# Patient Record
Sex: Male | Born: 1960 | Race: White | Hispanic: No | Marital: Married | Smoking: Never smoker
Health system: Southern US, Community
[De-identification: ages and names within clinical notes are randomized; demographics above are authoritative.]

## PROBLEM LIST (undated history)

## (undated) ENCOUNTER — Emergency Department (HOSPITAL_BASED_OUTPATIENT_CLINIC_OR_DEPARTMENT_OTHER): Disposition: A | Discharge status: Home / Self Care | Payer: Managed Care, Other (non HMO)

## (undated) DIAGNOSIS — K644 Residual hemorrhoidal skin tags: Secondary | ICD-10-CM

## (undated) DIAGNOSIS — D649 Anemia, unspecified: Secondary | ICD-10-CM

## (undated) HISTORY — DX: Residual hemorrhoidal skin tags: K64.4

## (undated) HISTORY — PX: CYSTECTOMY: SUR359

## (undated) HISTORY — DX: Anemia, unspecified: D64.9

---

## 1994-05-26 HISTORY — PX: BRAIN SURGERY: SHX531

## 2013-11-08 ENCOUNTER — Emergency Department (HOSPITAL_BASED_OUTPATIENT_CLINIC_OR_DEPARTMENT_OTHER)
Admission: EM | Admit: 2013-11-08 | Discharge: 2013-11-08 | Disposition: A | Discharge status: Home / Self Care | Payer: Managed Care, Other (non HMO) | Attending: Emergency Medicine | Admitting: Emergency Medicine

## 2013-11-08 ENCOUNTER — Encounter (HOSPITAL_BASED_OUTPATIENT_CLINIC_OR_DEPARTMENT_OTHER): Payer: Self-pay | Admitting: Emergency Medicine

## 2013-11-08 DIAGNOSIS — W268XXA Contact with other sharp object(s), not elsewhere classified, initial encounter: Secondary | ICD-10-CM | POA: Insufficient documentation

## 2013-11-08 DIAGNOSIS — Y939 Activity, unspecified: Secondary | ICD-10-CM | POA: Insufficient documentation

## 2013-11-08 DIAGNOSIS — S0100XA Unspecified open wound of scalp, initial encounter: Secondary | ICD-10-CM | POA: Insufficient documentation

## 2013-11-08 DIAGNOSIS — S0101XA Laceration without foreign body of scalp, initial encounter: Secondary | ICD-10-CM

## 2013-11-08 DIAGNOSIS — Y929 Unspecified place or not applicable: Secondary | ICD-10-CM | POA: Insufficient documentation

## 2013-11-08 DIAGNOSIS — Y990 Civilian activity done for income or pay: Secondary | ICD-10-CM | POA: Insufficient documentation

## 2013-11-08 NOTE — ED Notes (Signed)
MD at bedside. 

## 2013-11-08 NOTE — ED Provider Notes (Signed)
CSN: [id number]     Arrival date & time 11/08/13  1648 History   First MD Initiated Contact with Patient 11/08/13 1658     Chief Complaint  Patient presents with  . Head Injury     (Consider location/radiation/quality/duration/timing/severity/associated sxs/prior Treatment) HPI  This a 53 year old male who presents with laceration to the scalp. Patient sustained the injury 1 PM today. He states that a piece of metal on a machine cut his head. He has noted oozing from the site since then. He denies any other injury. He denies loss of consciousness. Tetanus shot is up-to-date. He does have a history of cystectomy with a bone flap on that side of his head.  History reviewed. No pertinent past medical history. Past Surgical History  Procedure Laterality Date  . Cystectomy     No family history on file. History  Substance Use Topics  . Smoking status: Never Smoker   . Smokeless tobacco: Not on file  . Alcohol Use: Yes    Review of Systems  Skin:       Scalp laceration  Neurological: Negative for dizziness and headaches.  All other systems reviewed and are negative.     Allergies  Review of patient's allergies indicates no known allergies.  Home Medications   Prior to Admission medications   Not on File   BP 112/78  Pulse 71  Temp(Src) 98.3 F (36.8 C) (Oral)  Resp 16  Ht 5\' 9"  (1.753 m)  Wt 165 lb 5.5 oz (75 kg)  BMI 24.41 kg/m2  SpO2 97% Physical Exam  Nursing note and vitals reviewed. Constitutional: He is oriented to person, place, and time. He appears well-developed and well-nourished. No distress.  HENT:  2 cm laceration to the scalp just lateral to the vertex on the right, there is notable indentation from prior cystectomy  Eyes: Pupils are equal, round, and reactive to light.  Cardiovascular: Normal rate and regular rhythm.   No murmur heard. Pulmonary/Chest: Effort normal. No respiratory distress.  Musculoskeletal: He exhibits no edema.   Lymphadenopathy:    He has no cervical adenopathy.  Neurological: He is alert and oriented to person, place, and time.  Skin: Skin is warm and dry.  Wound as noted above  Psychiatric: He has a normal mood and affect.    ED Course  Procedures (including critical care time)  LACERATION REPAIR Performed by: Thayer Jew, F Authorized by: Thayer Jew, F Consent: Verbal consent obtained. Risks and benefits: risks, benefits and alternatives were discussed Consent given by: patient Patient identity confirmed: provided demographic data Prepped and Draped in normal sterile fashion Wound explored  Laceration Location: scalp  Laceration Length: 2cm  No Foreign Bodies seen or palpated   Irrigation method: syringe Amount of cleaning: standard  Skin closure: hair approximation and dermabond  Technique:  Here from either side of the laceration was twisted to approximate the laceration and Dermabond was applied to seal  approximation  Patient tolerance: Patient tolerated the procedure well with no immediate complications.  Labs Review Labs Reviewed - No data to display  Imaging Review No results found.   EKG Interpretation None      MDM   Final diagnoses:  Scalp laceration    Patient presents with simple scalp laceration. Tetanus is up-to-date. No other known injury. Patient with like to avoid staples. His hair is long enough for hair approximation with Dermabond application. Patient tolerated the procedure well. Discussed with patient laceration care. Patient stated understanding.  After history, exam,  and medical workup I feel the patient has been appropriately medically screened and is safe for discharge home. Pertinent diagnoses were discussed with the patient. Patient was given return precautions.     Merryl Hacker, MD 11/08/13 [phone]

## 2013-11-08 NOTE — ED Notes (Signed)
Pt hit his head on a machine while at work. Now haas a very small abrasion/lac to top right of head oozing blood. Pt denies LOC, vision changes or nausea.

## 2013-11-08 NOTE — Discharge Instructions (Signed)
Tissue Adhesive Wound Care Do not wash your hair for 24 hrs and try to avoid soaking the glued area.  Some cuts, wounds, lacerations, and incisions can be repaired by using tissue adhesive. Tissue adhesive is like glue. It holds the skin together, allowing for faster healing. It forms a strong bond on the skin in about 1 minute and reaches its full strength in about 2 or 3 minutes. The adhesive disappears naturally while the wound is healing. It is important to take proper care of your wound at home while it heals.  HOME CARE INSTRUCTIONS   Showers are allowed. Do not soak the area containing the tissue adhesive. Do not take baths, swim, or use hot tubs. Do not use any soaps or ointments on the wound. Certain ointments can weaken the glue.  If a bandage (dressing) has been applied, follow your health care provider's instructions for how often to change the dressing.   Keep the dressing dry if one has been applied.   Do not scratch, pick, or rub the adhesive.   Do not place tape over the adhesive. The adhesive could come off when pulling the tape off.   Protect the wound from further injury until it is healed.   Protect the wound from sun and tanning bed exposure while it is healing and for several weeks after healing.   Only take over-the-counter or prescription medicines as directed by your health care provider.   Keep all follow-up appointments as directed by your health care provider. SEEK IMMEDIATE MEDICAL CARE IF:   Your wound becomes red, swollen, hot, or tender.   You develop a rash after the glue is applied.  You have increasing pain in the wound.   You have a red streak that goes away from the wound.   You have pus coming from the wound.   You have increased bleeding.  You have a fever.  You have shaking chills.   You notice a bad smell coming from the wound.   Your wound or adhesive breaks open.  MAKE SURE YOU:   Understand these  instructions.  Will watch your condition.  Will get help right away if you are not doing well or get worse. Document Released: 11/05/2000 Document Revised: 03/02/2013 Document Reviewed: 12/01/2012 [facility] Patient Information 2014 [place], [place].

## 2016-05-26 HISTORY — PX: HEMORRHOID SURGERY: SHX153

## 2016-10-22 ENCOUNTER — Emergency Department (HOSPITAL_BASED_OUTPATIENT_CLINIC_OR_DEPARTMENT_OTHER)
Admission: EM | Admit: 2016-10-22 | Discharge: 2016-10-23 | Disposition: A | Discharge status: Home / Self Care | Payer: Managed Care, Other (non HMO) | Attending: Emergency Medicine | Admitting: Emergency Medicine

## 2016-10-22 ENCOUNTER — Encounter (HOSPITAL_BASED_OUTPATIENT_CLINIC_OR_DEPARTMENT_OTHER): Payer: Self-pay

## 2016-10-22 ENCOUNTER — Emergency Department (HOSPITAL_BASED_OUTPATIENT_CLINIC_OR_DEPARTMENT_OTHER): Payer: Managed Care, Other (non HMO)

## 2016-10-22 DIAGNOSIS — Y999 Unspecified external cause status: Secondary | ICD-10-CM | POA: Diagnosis not present

## 2016-10-22 DIAGNOSIS — Y939 Activity, unspecified: Secondary | ICD-10-CM | POA: Insufficient documentation

## 2016-10-22 DIAGNOSIS — S40021A Contusion of right upper arm, initial encounter: Secondary | ICD-10-CM | POA: Diagnosis not present

## 2016-10-22 DIAGNOSIS — Y929 Unspecified place or not applicable: Secondary | ICD-10-CM | POA: Diagnosis not present

## 2016-10-22 DIAGNOSIS — M84822 Other disorders of continuity of bone, left humerus: Secondary | ICD-10-CM | POA: Insufficient documentation

## 2016-10-22 DIAGNOSIS — S40921A Unspecified superficial injury of right upper arm, initial encounter: Secondary | ICD-10-CM | POA: Diagnosis present

## 2016-10-22 DIAGNOSIS — S8002XA Contusion of left knee, initial encounter: Secondary | ICD-10-CM | POA: Diagnosis not present

## 2016-10-22 DIAGNOSIS — D1622 Benign neoplasm of long bones of left lower limb: Secondary | ICD-10-CM
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
## 2016-10-22 NOTE — ED Notes (Signed)
Pt to desk multiple times demanding the doctor to go see his wife who is also a pt. Stating pt needs pain medicine now. Explained to pt that doctor had to order pain medicine and would be in to see her as soon as possible. Pt refuses to stay in his room and is in his wife's room currently.

## 2016-10-22 NOTE — ED Notes (Signed)
ED Provider at bedside. 
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
## 2016-10-23 NOTE — Discharge Instructions (Signed)
Take acetaminophen, ibuprofen, or naproxen as needed for pain.  Your x-rays showed a spot on your left femur. This is probably benign, but you should follow up with the orthopedic doctor.

## 2016-11-03 ENCOUNTER — Encounter (INDEPENDENT_AMBULATORY_CARE_PROVIDER_SITE_OTHER): Payer: Self-pay | Admitting: Orthopaedic Surgery

## 2016-11-03 ENCOUNTER — Ambulatory Visit (INDEPENDENT_AMBULATORY_CARE_PROVIDER_SITE_OTHER): Payer: Managed Care, Other (non HMO)

## 2016-11-03 ENCOUNTER — Ambulatory Visit (INDEPENDENT_AMBULATORY_CARE_PROVIDER_SITE_OTHER): Payer: Managed Care, Other (non HMO) | Admitting: Orthopaedic Surgery

## 2016-11-03 DIAGNOSIS — D169 Benign neoplasm of bone and articular cartilage, unspecified: Secondary | ICD-10-CM | POA: Diagnosis not present
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
## 2018-11-24 ENCOUNTER — Telehealth: Payer: Self-pay | Admitting: Family Medicine

## 2018-11-24 NOTE — Telephone Encounter (Signed)
Attempted to call pt to reschd appt. # listed was not a working number

## 2018-12-08 ENCOUNTER — Ambulatory Visit: Payer: Managed Care, Other (non HMO) | Admitting: Family Medicine

## 2019-01-10 ENCOUNTER — Ambulatory Visit: Payer: Managed Care, Other (non HMO) | Admitting: Family Medicine

## 2019-01-10 ENCOUNTER — Encounter: Payer: Self-pay | Admitting: Family Medicine

## 2019-01-10 ENCOUNTER — Other Ambulatory Visit: Payer: Self-pay

## 2019-01-10 VITALS — BP 122/82 | HR 62 | Temp 98.2°F | Ht 72.0 in | Wt 171.1 lb

## 2019-01-10 DIAGNOSIS — B353 Tinea pedis: Secondary | ICD-10-CM | POA: Diagnosis not present

## 2019-01-10 DIAGNOSIS — K635 Polyp of colon: Secondary | ICD-10-CM | POA: Diagnosis not present

## 2019-01-10 DIAGNOSIS — B351 Tinea unguium: Secondary | ICD-10-CM

## 2019-01-10 DIAGNOSIS — L989 Disorder of the skin and subcutaneous tissue, unspecified: Secondary | ICD-10-CM | POA: Diagnosis not present

## 2019-01-10 DIAGNOSIS — R202 Paresthesia of skin: Secondary | ICD-10-CM

## 2019-01-10 DIAGNOSIS — K644 Residual hemorrhoidal skin tags: Secondary | ICD-10-CM

## 2019-01-10 DIAGNOSIS — N529 Male erectile dysfunction, unspecified: Secondary | ICD-10-CM

## 2019-01-10 MED ORDER — TADALAFIL 20 MG PO TABS: 20.0000 mg | ORAL_TABLET | Freq: Every day | ORAL | 4 refills | Status: DC | PRN

## 2019-01-10 MED ORDER — KETOCONAZOLE 2 % EX CREA: 1.0000 "application " | TOPICAL_CREAM | Freq: Every day | CUTANEOUS | 0 refills | Status: DC

## 2019-01-10 NOTE — Progress Notes (Signed)
Chief Complaint  Patient presents with  . New Patient (Initial Visit)    derm referral//podiatrist  . Referral    colonoscopy       New Patient Visit SUBJECTIVE: HPI: Michael Maldonado is an 58 y.o.male who is being seen for establishing care.  The patient was previously seen at [facility].  Hx of hemorrhoids.  He continues to have them intermittently.  They itch more than anything.  He saw GI 4-5 years ago and was found to have polyps and internal hemorrhoids.  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  He attributes the hemorrhoids to this.  His stools are relatively normal with occasional bouts of constipation.  Do not seem to correlate with his episodes of hemorrhoids.  No urinary complaints.  The patient has a personal history of colon polyps and a family history of colon cancer in his mother.  Last colonoscopy was 4-5 years ago.  He believes it was from Dr. Alice Reichert.  Follow-up was recommended for 2020 and he is requesting a referral.  No blood in the stools.   Patient has a history of erectile dysfunction.  He has used Cialis in the past and it worked well.  He would like a refill.  Patient has a history of left arm tingling.  This started around 3 months ago.  There is no injury or change in activity.  It comes intermittently and he could be doing anything.  He does not associate with exertion.  No chest pain or shortness of breath.  He denies any weakness or pain.  It seems to be getting better overall.  Denies fevers, bruising, unintentional weight loss.  The patient has had fungus on his toes for several months.  It affects both of his great toes.  They are not ingrown.  His right pinky is also affected.  He has tried an over-the-counter topical but is not consistent with it.  Denies any pain.  Patient has a history of a skin lesion on his right third medial toe.  It is white and sometimes itchy.  He has not tried anything on it so far.  He questions whether it is athlete's foot.   No new topicals.  He has skin lesions on face that he would like examined by a specialist. He treated with vinegar, they got better and then came back.   No Known Allergies  Past Medical History:  Diagnosis Date  . External hemorrhoids    Past Surgical History:  Procedure Laterality Date  . BRAIN SURGERY  1996   Remove araenoid cyst benign  . CYSTECTOMY    . HEMORRHOID SURGERY  2018   Family History  Problem Relation Age of Onset  . Cancer Mother        colon  . Hearing loss Father    No Known Allergies  Current Outpatient Medications:  Marland Kitchen  Multiple Vitamin (MULTIVITAMIN) tablet, Take 1 tablet by mouth daily., Disp: , Rfl:  .  Potassium 99 MG TABS, Take 1 tablet by mouth daily., Disp: , Rfl:  .  Zinc 50 MG CAPS, Take 1 each by mouth., Disp: , Rfl:  .  ketoconazole (NIZORAL) 2 % cream, Apply 1 application topically daily., Disp: 30 g, Rfl: 0 .  tadalafil (CIALIS) 20 MG tablet, Take 1 tablet (20 mg total) by mouth daily as needed for erectile dysfunction., Disp: 10 tablet, Rfl: 4  ROS Const: no fevers Cardiac: No CP Lungs: no sob Neuro: +tingling, no weakness MSK: No muscle  pain Skin: as above Endo: No wt loss GI: No bleeding GU: +ED Heme: No bruising   OBJECTIVE: BP 122/82 (BP Location: Left Arm, Patient Position: Sitting, Cuff Size: Normal)   Pulse 62   Temp 98.2 F (36.8 C) (Oral)   Ht 6' (1.829 m)   Wt 171 lb 2 oz (77.6 kg)   SpO2 97%   BMI 23.21 kg/m   Constitutional: -  VS reviewed -  Well developed, well nourished, appears stated age -  No apparent distress  Psychiatric: -  Oriented to person, place, and time -  Memory intact -  Affect and mood normal -  Fluent conversation, good eye contact -  Judgment and insight age appropriate  Eye: -  Conjunctivae clear, no discharge -  Pupils symmetric, round, reactive to light  ENMT: -  MMM    Pharynx moist, no exudate, no erythema  Neck: -  No gross swelling, no palpable masses -  Thyroid midline, not  enlarged, mobile, no palpable masses  Cardiovascular: -  RRR -  No bruits -  No LE edema  Respiratory: -  Normal respiratory effort, no accessory muscle use, no retraction -  Breath sounds equal, no wheezes, no ronchi, no crackles  Gastrointestinal: -  Bowel sounds normal -  No tenderness, no distention, no guarding, no masses  Neurological:  -  CN II - XII grossly intact -  5/5 strength throughout -  Neg Spurling's b/l -  DTR's equal and symmetric, no clonus -  No cerebellar signs -  Sensation intact to pinprick in UE's, equal bilaterally  Musculoskeletal: -  No clubbing, no cyanosis -  Gait normal  Skin: -  Thickened 1st and 5th nail on R, 1st nail on L, all yellowed -  Over the medial 3rd digit on R foot, there is a macerated circular lesion measuring approx 1 cm in diameter -  On the face there are various areas of excoriation and a few scaly patches, some areas of telangiectasias noted as well -  Warm and dry to palpation   ASSESSMENT/PLAN: Skin lesions - Plan: Ambulatory referral to Dermatology, many of lesions today are from shaving. There are a few he would like examined by a dermatologist. Referral placed.   External hemorrhoids - Plan: lift carefully, otc hydrocort ptn  Polyp of colon, unspecified part of colon, unspecified type - Plan: Ambulatory referral to Gastroenterology  Tinea pedis of right foot - Plan: ketoconazole qd for 6 weeks.  Onychomycosis - Plan: topical Vick's, discussed terbinafine, but he declined PO tx  Erectile dysfunction, unspecified erectile dysfunction type - Plan: Cialis  Paresthesias - Plan: reassurance for now. Can discuss lab testing at CPE. Stretch pecs for now.   Patient should return in 1 mo for CPE. The patient voiced understanding and agreement to the plan.   [place], DO 01/10/19  2:00 PM

## 2019-01-10 NOTE — Patient Instructions (Addendum)
If you do not hear anything about your referrals in the next 1-2 weeks, call our office and ask for an update.  Keep the diet clean and stay active.  Apply Vick's VapoRub on your toenails daily. This can take 9-15 months to show results.   The tingling does not appear to be anything particularly dangerous. If anything changes, let me know.   Let us know if you need anything.   Pectoralis Major Rehab Ask your health care provider which exercises are safe for you. Do exercises exactly as told by your health care provider and adjust them as directed. It is normal to feel mild stretching, pulling, tightness, or discomfort as you do these exercises, but you should stop right away if you feel sudden pain or your pain gets worse.Do not begin these exercises until told by your health care provider. Stretching and range of motion exercises These exercises warm up your muscles and joints and improve the movement and flexibility of your shoulder. These exercises can also help to relieve pain, numbness, and tingling. Exercise A: Pendulum  1. Stand near a wall or a surface that you can hold onto for balance. 2. Bend at the waist and let your left / right arm hang straight down. Use your other arm to keep your balance. 3. Relax your arm and shoulder muscles, and move your hips and your trunk so your left / right arm swings freely. Your arm should swing because of the motion of your body, not because you are using your arm or shoulder muscles. 4. Keep moving so your arm swings in the following directions, as told by your health care provider: ? Side to side. ? Forward and backward. ? In clockwise and counterclockwise circles. 5. Slowly return to the starting position. Repeat 2 times. Complete this exercise 3 times per week. Exercise B: Abduction, standing 1. Stand and hold a broomstick, a cane, or a similar object. Place your hands a little more than shoulder-width apart on the object. Your left / right  hand should be palm-up, and your other hand should be palm-down. 2. While keeping your elbow straight and your shoulder muscles relaxed, push the stick across your body toward your left / right side. Raise your left / right arm to the side of your body and then over your head until you feel a stretch in your shoulder. ? Stop when you reach the angle that is recommended by your health care provider. ? Avoid shrugging your shoulder while you raise your arm. Keep your shoulder blade tucked down toward the middle of your spine. 3. Hold for 10 seconds. 4. Slowly return to the starting position. Repeat 2 times. Complete this exercise 3 times per week. Exercise C: Wand flexion, supine  1. Lie on your back. You may bend your knees for comfort. 2. Hold a broomstick, a cane, or a similar object so that your hands are about shoulder-width apart on the object. Your palms should face toward your feet. 3. Raise your left / right arm in front of your face, then behind your head (toward the floor). Use your other hand to help you do this. Stop when you feel a gentle stretch in your shoulder, or when you reach the angle that is recommended by your health care provider. 4. Hold for 3 seconds. 5. Use the broomstick and your other arm to help you return your left / right arm to the starting position. Repeat 2 times. Complete this exercise 3 times per week.  Exercise D: Wand shoulder external rotation 1. Stand and hold a broomstick, a cane, or a similar object so your handsare about shoulder-width apart on the object. 2. Start with your arms hanging down, then bend both elbows to an "L" shape (90 degrees). 3. Keep your left / right elbow at your side. Use your other hand to push the stick so your left / right forearm moves away from your body, out to your side. ? Keep your left / right elbow bent to 90 degrees and keep it against your side. ? Stop when you feel a gentle stretch in your shoulder, or when you reach the  angle recommended by your health care provider. 4. Hold for 10 seconds. 5. Use the stick to help you return your left / right arm to the starting position. Repeat 2 times. Complete this exercise 3 times per week. Strengthening exercises These exercises build strength and endurance in your shoulder. Endurance is the ability to use your muscles for a long time, even after your muscles get tired. Exercise E: Scapular protraction, standing 1. Stand so you are facing a wall. Place your feet about one arm-length away from the wall. 2. Place your hands on the wall and straighten your elbows. 3. Keep your hands on the wall as you push your upper back away from the wall. You should feel your shoulder blades sliding forward.Keep your elbows and your head still. ? If you are not sure that you are doing this exercise correctly, ask your health care provider for more instructions. 4. Hold for 3 seconds. 5. Slowly return to the starting position. Let your muscles relax completely before you repeat this exercise. Repeat 2 times. Complete this exercise 3 times per week. Exercise F: Shoulder blade squeezes  (scapular retraction) 1. Sit with good posture in a stable chair. Do not let your back touch the back of the chair. 2. Your arms should be at your sides with your elbows bent. You may rest your forearms on a pillow if that is more comfortable. 3. Squeeze your shoulder blades together. Bring them down and back. ? Keep your shoulders level. ? Do not lift your shoulders up toward your ears. 4. Hold for 3 seconds. 5. Return to the starting position. Repeat 2 times. Complete this exercise 3 times per week. This information is not intended to replace advice given to you by your health care provider. Make sure you discuss any questions you have with your health care provider. Document Released: 05/12/2005 Document Revised: 02/21/2016 Document Reviewed: 01/28/2015 Elsevier Interactive Patient Education  [employer].

## 2019-02-11 ENCOUNTER — Telehealth: Payer: Self-pay | Admitting: Gastroenterology

## 2019-02-11 NOTE — Telephone Encounter (Signed)
Hi Dr. Fuller Plan, we have received a referral from pt's PCP for a repeat colon, Pt had last colon in 2015. Records will be placed on your desk for review. Please advise for scheduling. Thank you.

## 2019-02-15 ENCOUNTER — Encounter: Payer: Managed Care, Other (non HMO) | Admitting: Family Medicine

## 2019-02-21 ENCOUNTER — Other Ambulatory Visit: Payer: Self-pay

## 2019-02-21 ENCOUNTER — Ambulatory Visit (INDEPENDENT_AMBULATORY_CARE_PROVIDER_SITE_OTHER): Payer: Managed Care, Other (non HMO) | Admitting: Family Medicine

## 2019-02-21 ENCOUNTER — Encounter: Payer: Self-pay | Admitting: Family Medicine

## 2019-02-21 VITALS — BP 110/70 | HR 70 | Temp 96.7°F | Ht 69.0 in | Wt 168.2 lb

## 2019-02-21 DIAGNOSIS — R0681 Apnea, not elsewhere classified: Secondary | ICD-10-CM | POA: Diagnosis not present

## 2019-02-21 DIAGNOSIS — Z2821 Immunization not carried out because of patient refusal: Secondary | ICD-10-CM | POA: Diagnosis not present

## 2019-02-21 DIAGNOSIS — Z1159 Encounter for screening for other viral diseases: Secondary | ICD-10-CM

## 2019-02-21 DIAGNOSIS — Z0000 Encounter for general adult medical examination without abnormal findings: Secondary | ICD-10-CM | POA: Diagnosis not present

## 2019-02-21 LAB — CBC
HCT: 46.1 % (ref 39.0–52.0)
Hemoglobin: 15.6 g/dL (ref 13.0–17.0)
MCHC: 33.8 g/dL (ref 30.0–36.0)
MCV: 88.6 fl (ref 78.0–100.0)
Platelets: 173 10*3/uL (ref 150.0–400.0)
RBC: 5.2 Mil/uL (ref 4.22–5.81)
RDW: 12.7 % (ref 11.5–15.5)
WBC: 6.4 10*3/uL (ref 4.0–10.5)

## 2019-02-21 LAB — COMPREHENSIVE METABOLIC PANEL
ALT: 14 U/L (ref 0–53)
AST: 14 U/L (ref 0–37)
Albumin: 4.7 g/dL (ref 3.5–5.2)
Alkaline Phosphatase: 57 U/L (ref 39–117)
BUN: 24 mg/dL — ABNORMAL HIGH (ref 6–23)
CO2: 29 mEq/L (ref 19–32)
Calcium: 9.6 mg/dL (ref 8.4–10.5)
Chloride: 103 mEq/L (ref 96–112)
Creatinine, Ser: 1.06 mg/dL (ref 0.40–1.50)
GFR: 72.15 mL/min (ref >60.00)
Glucose, Bld: 84 mg/dL (ref 70–99)
Potassium: 3.9 mEq/L (ref 3.5–5.1)
Sodium: 140 mEq/L (ref 135–145)
Total Bilirubin: 0.8 mg/dL (ref 0.2–1.2)
Total Protein: 6.9 g/dL (ref 6.0–8.3)

## 2019-02-21 LAB — LIPID PANEL
Cholesterol: 170 mg/dL (ref 0–200)
HDL: 52 mg/dL (ref >39.00)
LDL Cholesterol: 103 mg/dL — ABNORMAL HIGH (ref 0–99)
NonHDL: 117.82
Total CHOL/HDL Ratio: 3
Triglycerides: 75 mg/dL (ref 0.0–149.0)
VLDL: 15 mg/dL (ref 0.0–40.0)

## 2019-02-21 NOTE — Progress Notes (Signed)
Chief Complaint  Patient presents with  . Annual Exam    snoring    Well Male Michael Maldonado is here for a complete physical.   His last physical was >1 year ago.  Current diet: in general, a "healthy" diet.  Current exercise: active at work Weight trend: stable Daytime fatigue? Some. +witness apneic episodes and snoring. Seat belt? Yes.    Health maintenance Shingrix- No Colonoscopy- in process Tetanus- Yes- thinks he did get it HIV- Yes Hep C- No   Past Medical History:  Diagnosis Date  . External hemorrhoids       Past Surgical History:  Procedure Laterality Date  . BRAIN SURGERY  1996   Remove araenoid cyst benign  . CYSTECTOMY    . HEMORRHOID SURGERY  2018    Medications  Current Outpatient Medications on File Prior to Visit  Medication Sig Dispense Refill  . ketoconazole (NIZORAL) 2 % cream Apply 1 application topically daily. 30 g 0  . Multiple Vitamin (MULTIVITAMIN) tablet Take 1 tablet by mouth daily.    . Potassium 99 MG TABS Take 1 tablet by mouth daily.    . tadalafil (CIALIS) 20 MG tablet Take 1 tablet (20 mg total) by mouth daily as needed for erectile dysfunction. 10 tablet 4  . Zinc 50 MG CAPS Take 1 each by mouth.     Allergies No Known Allergies  Family History Family History  Problem Relation Age of Onset  . Cancer Mother        colon  . Hearing loss Father     Review of Systems: Constitutional:  no fevers Eye:  no recent significant change in vision Ear/Nose/Mouth/Throat:  Ears:  no hearing loss Nose/Mouth/Throat:  no complaints of nasal congestion, no sore throat Cardiovascular:  no chest pain, no palpitations Respiratory:  no cough and no shortness of breath Gastrointestinal:  no abdominal pain, no change in bowel habits GU:  Male: negative for dysuria, frequency, and incontinence and negative for prostate symptoms Musculoskeletal/Extremities:  no pain, redness, or swelling of the joints Integumentary (Skin/Breast):  no abnormal  skin lesions reported Neurologic:  no headaches Endocrine: No unexpected weight changes Hematologic/Lymphatic:  no abnormal bleeding  Exam BP 110/70 (BP Location: Left Arm, Patient Position: Sitting, Cuff Size: Normal)   Pulse 70   Temp (!) 96.7 F (35.9 C) (Temporal)   Ht 5\' 9"  (1.753 m)   Wt 168 lb 4 oz (76.3 kg)   SpO2 97%   BMI 24.85 kg/m  General:  well developed, well nourished, in no apparent distress Skin:  no significant moles, warts, or growths Head:  no masses, lesions, or tenderness Eyes:  pupils equal and round, sclera anicteric without injection Ears:  canals without lesions, TMs shiny without retraction, no obvious effusion, no erythema Nose:  nares patent, septum midline, mucosa normal Throat/Pharynx:  lips and gingiva without lesion; tongue and uvula midline; non-inflamed pharynx; no exudates or postnasal drainage Neck: neck supple without adenopathy, thyromegaly, or masses Lungs:  clear to auscultation, breath sounds equal bilaterally, no respiratory distress Cardio:  regular rate and rhythm, no LE edema, no bruits Abdomen:  abdomen soft, nontender; bowel sounds normal; no masses or organomegaly Rectal: Deferred Musculoskeletal:  symmetrical muscle groups noted without atrophy or deformity Extremities:  no clubbing, cyanosis, or edema, no deformities, no skin discoloration Neuro:  gait normal; deep tendon reflexes normal and symmetric Psych: well oriented with normal range of affect and appropriate judgment/insight  Assessment and Plan  Well adult exam -  Plan: CBC, Comprehensive metabolic panel, Lipid panel  Witnessed apneic spells - Plan: Ambulatory referral to Pulmonology  Encounter for hepatitis C screening test for low risk patient - Plan: Hepatitis C antibody  Refused influenza vaccine   Well 58 y.o. male. Counseled on diet and exercise. Counseled on risks and benefits of prostate cancer screening with PSA. The patient agrees to forego  testing. Immunizations, labs, and further orders as above. Going to look into when he had tdap.  Follow up in 1 yr or prn. The patient voiced understanding and agreement to the plan.  [place], DO 02/21/19 8:37 AM

## 2019-02-21 NOTE — Patient Instructions (Addendum)
Give [place] 2-3 business days to get the results of your labs back.   Keep the diet clean and stay active.  The new Shingrix vaccine (for shingles) is a 2 shot series. It can make people feel low energy, achy and almost like they have the flu for 48 hours after injection. Please plan accordingly when deciding on when to get this shot. Call our office for a nurse visit appointment to get this. The second shot of the series is less severe regarding the side effects, but it still lasts 48 hours.   If you do not hear anything about your referral in the next 1-2 weeks, call our office and ask for an update.   I recommend getting the flu shot in mid October. This suggestion would change if the CDC comes out with a different recommendation.    Let us know if you need anything.

## 2019-02-22 LAB — HEPATITIS C ANTIBODY
Hepatitis C Ab: NONREACTIVE
SIGNAL TO CUT-OFF: 0.01 (ref <1.00)

## 2019-02-23 ENCOUNTER — Encounter: Payer: Self-pay | Admitting: Family Medicine

## 2019-02-23 NOTE — Telephone Encounter (Signed)
Please update status of records review.  °

## 2019-02-23 NOTE — Telephone Encounter (Signed)
Records are not on his desk for review.

## 2019-02-24 NOTE — Telephone Encounter (Signed)
We were able to obtain records again, they will be sent for review.

## 2019-03-01 ENCOUNTER — Encounter: Payer: Self-pay | Admitting: Gastroenterology
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
## 2019-03-31 ENCOUNTER — Encounter: Payer: Self-pay | Admitting: Gastroenterology

## 2019-03-31 ENCOUNTER — Ambulatory Visit (AMBULATORY_SURGERY_CENTER): Payer: Self-pay | Admitting: *Deleted

## 2019-03-31 ENCOUNTER — Other Ambulatory Visit: Payer: Self-pay

## 2019-03-31 VITALS — Temp 96.6°F | Ht 70.0 in | Wt 167.4 lb

## 2019-03-31 DIAGNOSIS — Z800 Family history of malignant neoplasm of digestive organs: Secondary | ICD-10-CM

## 2019-03-31 MED ORDER — SUPREP BOWEL PREP KIT 17.5-3.13-1.6 GM/177ML PO SOLN: 1.0000 | Freq: Once | ORAL | 0 refills | Status: AC

## 2019-03-31 NOTE — Progress Notes (Signed)
No egg or soy allergy known to patient  No issues with past sedation with any surgeries  or procedures, no intubation problems  No diet pills per patient No home 02 use per patient  No blood thinners per patient  Pt denies issues with constipation  No A fib or A flutter  EMMI videogiven suprep coupon given covid test, [place] 04/20/2019 at 815  Due to the COVID-19 pandemic we are asking patients to follow these guidelines. Please only bring one care partner. Please be aware that your care partner may wait in the car in the parking lot or if they feel like they will be too hot to wait in the car, they may wait in the lobby on the 4th floor. All care partners are required to wear a mask the entire time (we do not have any that we can provide them), they need to practice social distancing, and we will do a Covid check for all patient's and care partners when you arrive. Also we will check their temperature and your temperature. If the care partner waits in their car they need to stay in the parking lot the entire time and we will call them on their cell phone when the patient is ready for discharge so they can bring the car to the front of the building. Also all patient's will need to wear a mask into building.

## 2019-04-25 ENCOUNTER — Encounter: Payer: Managed Care, Other (non HMO) | Admitting: Gastroenterology

## 2019-05-09 ENCOUNTER — Other Ambulatory Visit: Payer: Self-pay | Admitting: Gastroenterology

## 2019-05-09 ENCOUNTER — Ambulatory Visit (INDEPENDENT_AMBULATORY_CARE_PROVIDER_SITE_OTHER): Payer: Managed Care, Other (non HMO)

## 2019-05-09 DIAGNOSIS — Z1159 Encounter for screening for other viral diseases: Secondary | ICD-10-CM

## 2019-05-10 LAB — SARS CORONAVIRUS 2 (TAT 6-24 HRS): SARS Coronavirus 2: NEGATIVE

## 2019-05-11 ENCOUNTER — Encounter: Payer: Self-pay | Admitting: Gastroenterology

## 2019-05-11 ENCOUNTER — Ambulatory Visit (AMBULATORY_SURGERY_CENTER): Payer: Managed Care, Other (non HMO) | Admitting: Gastroenterology

## 2019-05-11 ENCOUNTER — Other Ambulatory Visit: Payer: Self-pay

## 2019-05-11 VITALS — BP 111/76 | HR 65 | Resp 10 | Ht 70.0 in | Wt 167.4 lb

## 2019-05-11 DIAGNOSIS — Z1211 Encounter for screening for malignant neoplasm of colon: Secondary | ICD-10-CM | POA: Diagnosis not present

## 2019-05-11 DIAGNOSIS — Z800 Family history of malignant neoplasm of digestive organs: Secondary | ICD-10-CM | POA: Diagnosis not present

## 2019-05-11 MED ORDER — SODIUM CHLORIDE 0.9 % IV SOLN: 500.0000 mL | Freq: Once | INTRAVENOUS | Status: DC

## 2019-05-11 NOTE — Progress Notes (Signed)
Pt's states no medical or surgical changes since previsit or office visit.  Temp taken by LC VS taken by CW  

## 2019-05-11 NOTE — Patient Instructions (Signed)
YOU HAD AN ENDOSCOPIC PROCEDURE TODAY AT THE [facility]:   Refer to the procedure report that was given to you for any specific questions about what was found during the examination.  If the procedure report does not answer your questions, please call your gastroenterologist to clarify.  If you requested that your care partner not be given the details of your procedure findings, then the procedure report has been included in a sealed envelope for you to review at your convenience later.  YOU SHOULD EXPECT: Some feelings of bloating in the abdomen. Passage of more gas than usual.  Walking can help get rid of the air that was put into your GI tract during the procedure and reduce the bloating. If you had a lower endoscopy (such as a colonoscopy or flexible sigmoidoscopy) you may notice spotting of blood in your stool or on the toilet paper. If you underwent a bowel prep for your procedure, you may not have a normal bowel movement for a few days.  Please Note:  You might notice some irritation and congestion in your nose or some drainage.  This is from the oxygen used during your procedure.  There is no need for concern and it should clear up in a day or so.  SYMPTOMS TO REPORT IMMEDIATELY:   Following lower endoscopy (colonoscopy or flexible sigmoidoscopy):  Excessive amounts of blood in the stool  Significant tenderness or worsening of abdominal pains  Swelling of the abdomen that is new, acute  Fever of 100F or higher  For urgent or emergent issues, a gastroenterologist can be reached at any hour by calling [phone].   DIET:  We do recommend a small meal at first, but then you may proceed to your regular diet.  Drink plenty of fluids but you should avoid alcoholic beverages for 24 hours.  ACTIVITY:  You should plan to take it easy for the rest of today and you should NOT DRIVE or use heavy machinery until tomorrow (because of the sedation medicines used during the test).     FOLLOW UP: Our staff will call the number listed on your records 48-72 hours following your procedure to check on you and address any questions or concerns that you may have regarding the information given to you following your procedure. If we do not reach you, we will leave a message.  We will attempt to reach you two times.  During this call, we will ask if you have developed any symptoms of COVID 19. If you develop any symptoms (ie: fever, flu-like symptoms, shortness of breath, cough etc.) before then, please call [phone].  If you test positive for Covid 19 in the 2 weeks post procedure, please call and report this information to us.    If any biopsies were taken you will be contacted by phone or by letter within the next 1-3 weeks.  Please call us at [phone] if you have not heard about the biopsies in 3 weeks.    SIGNATURES/CONFIDENTIALITY: You and/or your care partner have signed paperwork which will be entered into your electronic medical record.  These signatures attest to the fact that that the information above on your After Visit Summary has been reviewed and is understood.  Full responsibility of the confidentiality of this discharge information lies with you and/or your care-partner. 

## 2019-05-11 NOTE — Op Note (Signed)
[place] Patient Name: Michael Maldonado Procedure Date: 05/11/2019 12:28 PM MRN: [id number] Endoscopist: Ladene Artist , MD Age: 58 Referring MD:  Date of Birth: 1961/05/01 Gender: Male Account #: [id number] Procedure:                Colonoscopy Indications:              Screening in patient at increased risk: Family                            history of 1st-degree relative with colorectal                            cancer Medicines:                Monitored Anesthesia Care Procedure:                Pre-Anesthesia Assessment:                           - Prior to the procedure, a History and Physical                            was performed, and patient medications and                            allergies were reviewed. The patient's tolerance of                            previous anesthesia was also reviewed. The risks                            and benefits of the procedure and the sedation                            options and risks were discussed with the patient.                            All questions were answered, and informed consent                            was obtained. Prior Anticoagulants: The patient has                            taken no previous anticoagulant or antiplatelet                            agents. ASA Grade Assessment: II - A patient with                            mild systemic disease. After reviewing the risks                            and benefits, the patient was deemed in  satisfactory condition to undergo the procedure.                           After obtaining informed consent, the colonoscope                            was passed under direct vision. Throughout the                            procedure, the patient's blood pressure, pulse, and                            oxygen saturations were monitored continuously. The                            Colonoscope was introduced through the anus and                  advanced to the the cecum, identified by                            appendiceal orifice and ileocecal valve. The                            ileocecal valve, appendiceal orifice, and rectum                            were photographed. The quality of the bowel                            preparation was good. The colonoscopy was performed                            without difficulty. The patient tolerated the                            procedure well. Scope In: [id number] PM Scope Out: 12:48:52 PM Scope Withdrawal Time: 0 hours 10 minutes 25 seconds  Total Procedure Duration: 0 hours 11 minutes 53 seconds  Findings:                 The perianal and digital rectal examinations were                            normal.                           Multiple medium-mouthed diverticula were found in                            the entire colon. There was no evidence of                            diverticular bleeding.                           Internal hemorrhoids, scarring from likely  prior                            hemorrhoid banding were found during retroflexion.                            The hemorrhoids were small and Grade I (internal                            hemorrhoids that do not prolapse).                           The exam was otherwise without abnormality on                            direct and retroflexion views. Complications:            No immediate complications. Estimated blood loss:                            None. Estimated Blood Loss:     Estimated blood loss: none. Impression:               - Moderate diverticulosis in the entire examined                            colon.                           - Internal hemorrhoids.                           - The examination was otherwise normal on direct                            and retroflexion views.                           - No specimens collected. Recommendation:           - Repeat colonoscopy in 5 years for  screening                            purposes.                           - Patient has a contact number available for                            emergencies. The signs and symptoms of potential                            delayed complications were discussed with the                            patient. Return to normal activities tomorrow.  Written discharge instructions were provided to the                            patient.                           - High fiber diet.                           - Continue present medications.                           - Await pathology results. Ladene Artist, MD 05/11/2019 12:52:36 PM This report has been signed electronically.

## 2019-05-11 NOTE — Progress Notes (Signed)
Report given to PACU, vss 

## 2019-05-13 ENCOUNTER — Telehealth: Payer: Self-pay

## 2019-05-13 NOTE — Telephone Encounter (Signed)
Covid-19 screening questions   Do you now or have you had a fever in the last 14 days? No.  Do you have any respiratory symptoms of shortness of breath or cough now or in the last 14 days? No.  Do you have any family members or close contacts with diagnosed or suspected Covid-19 in the past 14 days? No.  Have you been tested for Covid-19 and found to be positive? No.       Follow up Call-  Call back number 05/11/2019  Post procedure Call Back phone  # [phone]  Permission to leave phone message Yes  Some recent data might be hidden     Patient questions:  Do you have a fever, pain , or abdominal swelling? No. Pain Score  0 *  Have you tolerated food without any problems? Yes.    Have you been able to return to your normal activities? Yes.    Do you have any questions about your discharge instructions: Diet   No. Medications  No. Follow up visit  No.  Do you have questions or concerns about your Care? No.  Actions: * If pain score is 4 or above: No action needed, pain <4.

## 2019-06-08 ENCOUNTER — Ambulatory Visit: Payer: Managed Care, Other (non HMO) | Attending: Internal Medicine

## 2019-06-08 DIAGNOSIS — Z20822 Contact with and (suspected) exposure to covid-19: Secondary | ICD-10-CM

## 2019-06-10 LAB — NOVEL CORONAVIRUS, NAA: SARS-CoV-2, NAA: NOT DETECTED

## 2020-01-02 ENCOUNTER — Other Ambulatory Visit (INDEPENDENT_AMBULATORY_CARE_PROVIDER_SITE_OTHER): Payer: Managed Care, Other (non HMO)

## 2020-01-02 ENCOUNTER — Encounter: Payer: Self-pay | Admitting: Family Medicine

## 2020-01-02 ENCOUNTER — Other Ambulatory Visit: Payer: Self-pay

## 2020-01-02 ENCOUNTER — Ambulatory Visit (INDEPENDENT_AMBULATORY_CARE_PROVIDER_SITE_OTHER): Payer: Managed Care, Other (non HMO) | Admitting: Family Medicine

## 2020-01-02 VITALS — BP 112/78 | HR 50 | Temp 98.0°F | Ht 69.0 in | Wt 176.0 lb

## 2020-01-02 DIAGNOSIS — B351 Tinea unguium: Secondary | ICD-10-CM | POA: Diagnosis not present

## 2020-01-02 DIAGNOSIS — Z0000 Encounter for general adult medical examination without abnormal findings: Secondary | ICD-10-CM

## 2020-01-02 DIAGNOSIS — R739 Hyperglycemia, unspecified: Secondary | ICD-10-CM

## 2020-01-02 DIAGNOSIS — Z23 Encounter for immunization: Secondary | ICD-10-CM

## 2020-01-02 DIAGNOSIS — L989 Disorder of the skin and subcutaneous tissue, unspecified: Secondary | ICD-10-CM

## 2020-01-02 LAB — LIPID PANEL
Cholesterol: 156 mg/dL (ref 0–200)
HDL: 48.1 mg/dL (ref >39.00)
LDL Cholesterol: 92 mg/dL (ref 0–99)
NonHDL: 107.88
Total CHOL/HDL Ratio: 3
Triglycerides: 79 mg/dL (ref 0.0–149.0)
VLDL: 15.8 mg/dL (ref 0.0–40.0)

## 2020-01-02 LAB — COMPREHENSIVE METABOLIC PANEL
ALT: 17 U/L (ref 0–53)
AST: 15 U/L (ref 0–37)
Albumin: 4.6 g/dL (ref 3.5–5.2)
Alkaline Phosphatase: 49 U/L (ref 39–117)
BUN: 22 mg/dL (ref 6–23)
CO2: 27 mEq/L (ref 19–32)
Calcium: 9.4 mg/dL (ref 8.4–10.5)
Chloride: 103 mEq/L (ref 96–112)
Creatinine, Ser: 1.11 mg/dL (ref 0.40–1.50)
GFR: 68.2 mL/min (ref >60.00)
Glucose, Bld: 105 mg/dL — ABNORMAL HIGH (ref 70–99)
Potassium: 3.8 mEq/L (ref 3.5–5.1)
Sodium: 138 mEq/L (ref 135–145)
Total Bilirubin: 0.4 mg/dL (ref 0.2–1.2)
Total Protein: 7.1 g/dL (ref 6.0–8.3)

## 2020-01-02 LAB — CBC
HCT: 45.1 % (ref 39.0–52.0)
Hemoglobin: 15.3 g/dL (ref 13.0–17.0)
MCHC: 34.1 g/dL (ref 30.0–36.0)
MCV: 87.9 fl (ref 78.0–100.0)
Platelets: 162 10*3/uL (ref 150.0–400.0)
RBC: 5.12 Mil/uL (ref 4.22–5.81)
RDW: 13.2 % (ref 11.5–15.5)
WBC: 6.3 10*3/uL (ref 4.0–10.5)

## 2020-01-02 LAB — HEMOGLOBIN A1C: Hgb A1c MFr Bld: 5.5 % (ref 4.6–6.5)

## 2020-01-02 MED ORDER — TERBINAFINE HCL 250 MG PO TABS: 250.0000 mg | ORAL_TABLET | Freq: Every day | ORAL | 3 refills | Status: DC

## 2020-01-02 MED ORDER — TADALAFIL 20 MG PO TABS: 20.0000 mg | ORAL_TABLET | Freq: Every day | ORAL | 4 refills | Status: DC | PRN

## 2020-01-02 NOTE — Addendum Note (Signed)
Addended by: Sharon Seller B on: 01/02/2020 08:38 AM   Modules accepted: Orders

## 2020-01-02 NOTE — Patient Instructions (Addendum)
Give [place] 2-3 business days to get the results of your labs back.   Keep the diet clean and stay active.  I recommend getting the flu shot in mid October. This suggestion would change if the CDC comes out with a different recommendation.   Try to get 7-9 hours of sleep nightly.  The new Shingrix vaccine (for shingles) is a 2 shot series. It can make people feel low energy, achy and almost like they have the flu for 48 hours after injection. Please plan accordingly when deciding on when to get this shot. Call our office for a nurse visit appointment to get this. The second shot of the series is less severe regarding the side effects, but it still lasts 48 hours.   If you do not hear anything about your referral in the next 1-2 weeks, call our office and ask for an update.  Let us know if you need anything.

## 2020-01-02 NOTE — Progress Notes (Signed)
Chief Complaint  Patient presents with  . Annual Exam    Well Male Michael Maldonado is here for a complete physical.   His last physical was last year.  Current diet: in general, a "healthy" diet.  Current exercise: active Weight trend: up a little Fatigue out of ordinary? No. Seat belt? Yes.    Health maintenance Shingrix- No Colonoscopy- Yes Tetanus- No HIV- Yes Hep C- Yes   Past Medical History:  Diagnosis Date  . Anemia   . External hemorrhoids       Past Surgical History:  Procedure Laterality Date  . BRAIN SURGERY  1996   Remove araenoid cyst benign  . CYSTECTOMY    . HEMORRHOID SURGERY  2018   Medications  Current Outpatient Medications on File Prior to Visit  Medication Sig Dispense Refill  . Multiple Vitamin (MULTIVITAMIN) tablet Take 1 tablet by mouth daily.    . tadalafil (CIALIS) 20 MG tablet Take 1 tablet (20 mg total) by mouth daily as needed for erectile dysfunction. 10 tablet 4  . Zinc 50 MG CAPS Take 1 each by mouth.     Allergies No Known Allergies  Family History Family History  Problem Relation Age of Onset  . Cancer Mother        colon  . Colon cancer Mother   . Hearing loss Father   . Esophageal cancer Neg Hx   . Rectal cancer Neg Hx   . Stomach cancer Neg Hx     Review of Systems: Constitutional:  no fevers Eye:  no recent significant change in vision Ear/Nose/Mouth/Throat:  Ears:  no hearing loss Nose/Mouth/Throat:  no complaints of nasal congestion, no sore throat Cardiovascular:  no chest pain Respiratory:  no shortness of breath Gastrointestinal:  no change in bowel habits GU:  Male: negative for dysuria, frequency Musculoskeletal/Extremities:  no joint pain Integumentary (Skin/Breast):  +lightening of skin on face, +nail fungus Neurologic:  no headaches Endocrine: No unexpected weight changes Hematologic/Lymphatic:  no abnormal bleeding  Exam BP 112/78 (BP Location: Left Arm, Patient Position: Sitting, Cuff Size: Normal)    Pulse (!) 50   Temp 98 F (36.7 C) (Oral)   Ht 5\' 9"  (1.753 m)   Wt 176 lb (79.8 kg)   SpO2 96%   BMI 25.99 kg/m  General:  well developed, well nourished, in no apparent distress Skin: Onycolysis and yellowing of nails 1, 4, 5 on R, and great toe on L; there is some lightening of skin on forehead and around lip; otherwise no significant moles, warts, or growths Head:  no masses, lesions, or tenderness Eyes:  pupils equal and round, sclera anicteric without injection Ears:  canals without lesions, TMs shiny without retraction, no obvious effusion, no erythema Nose:  nares patent, septum midline, mucosa normal Throat/Pharynx:  lips and gingiva without lesion; tongue and uvula midline; non-inflamed pharynx; no exudates or postnasal drainage Neck: neck supple without adenopathy, thyromegaly, or masses Cardiac: RRR, no bruits, no LE edema Lungs:  clear to auscultation, breath sounds equal bilaterally, no respiratory distress Rectal: Deferred Musculoskeletal:  symmetrical muscle groups noted without atrophy or deformity Neuro:  gait normal; deep tendon reflexes normal and symmetric Psych: well oriented with normal range of affect and appropriate judgment/insight  Assessment and Plan  Well adult exam - Plan: CBC, Comprehensive metabolic panel, Lipid panel  Skin lesion - Plan: Ambulatory referral to Dermatology  Onychomycosis - Plan: terbinafine (LAMISIL) 250 MG tablet   Well 59 y.o. male. Counseled on diet and  exercise. Counseled on risks and benefits of prostate cancer screening with PSA. The patient agrees to forego testing. Immunizations, labs, and further orders as above. Follow up 3 mo for reck toes hep function panel, 1 yr for CPE. The patient voiced understanding and agreement to the plan.  [place], DO 01/02/20 8:30 AM\

## 2020-04-02 ENCOUNTER — Ambulatory Visit: Payer: Managed Care, Other (non HMO) | Admitting: Family Medicine

## 2020-04-09 ENCOUNTER — Ambulatory Visit: Payer: Managed Care, Other (non HMO) | Admitting: Family Medicine

## 2020-04-09 ENCOUNTER — Other Ambulatory Visit: Payer: Self-pay

## 2020-04-09 ENCOUNTER — Encounter: Payer: Self-pay | Admitting: Family Medicine

## 2020-04-09 VITALS — BP 110/76 | HR 75 | Temp 98.3°F | Resp 12 | Ht 69.0 in | Wt 175.0 lb

## 2020-04-09 DIAGNOSIS — B351 Tinea unguium: Secondary | ICD-10-CM

## 2020-04-09 DIAGNOSIS — L989 Disorder of the skin and subcutaneous tissue, unspecified: Secondary | ICD-10-CM

## 2020-04-09 LAB — HEPATIC FUNCTION PANEL
ALT: 17 U/L (ref 0–53)
AST: 16 U/L (ref 0–37)
Albumin: 4.4 g/dL (ref 3.5–5.2)
Alkaline Phosphatase: 55 U/L (ref 39–117)
Bilirubin, Direct: 0.1 mg/dL (ref 0.0–0.3)
Total Bilirubin: 0.5 mg/dL (ref 0.2–1.2)
Total Protein: 6.7 g/dL (ref 6.0–8.3)

## 2020-04-09 NOTE — Patient Instructions (Signed)
Soak your right foot nightly and pare down the dead skin. Use a pumice stone or foot file. After that, take some aspirin and add water making a paste. Apply it for 10 minutes and then remove. Try this for 10-14 days. If no improvement, this is not likely related to a wart.  Continue the medicine.  Give [place] 2-3 business days to get the results of your labs back.   Let us know if you need anything.

## 2020-04-09 NOTE — Progress Notes (Signed)
Chief Complaint  Patient presents with  . recheck nails    Michael Maldonado is a 59 y.o. male here for a skin complaint.   Patient is following up for onychomycosis of his nails.  He reports they are doing better in the deformed portions of the nails are growing out with normal nail growing in.  He is compliant with the Lamisil 250 mg daily.  No adverse effects.  There is a location on his fourth medial toe on the right that is thickened and will sometimes turn white.  It has been there for 42 years.  It does not hurt or drain.  There is no itching.  He has tried fungal cream without relief.  He just started putting wart cream on it.  Past Medical History:  Diagnosis Date  . Anemia   . External hemorrhoids     BP 110/76 (BP Location: Left Arm, Cuff Size: Normal)   Pulse 75   Temp 98.3 F (36.8 C) (Oral)   Resp 12   Ht 5\' 9"  (1.753 m)   Wt 175 lb (79.4 kg)   SpO2 92%   BMI 25.84 kg/m  Gen: awake, alert, appearing stated age Lungs: No accessory muscle use Skin: Thickened and yellowed nails over 1, 4, and 5 on the right and one on the left.  These areas of dystrophic nails appear to be growing out with normal nail growing in.  There is also a moist appearing and lichenified patch on the medial 4th digit on the R toe. No erythema, drainage, ttp.  Psych: Age appropriate judgment and insight  Onychomycosis - Plan: Hepatic function panel  Skin lesion  1. Cont Lamisil. Ck LFT's. F/u in 3 mo if still bothersome.  2. Pare down lesion. Use topical aspirin paste for 10 min daily for next 10-14 d. Will consider biopsy vs referral if no better.  The patient voiced understanding and agreement to the plan.  [place], DO 04/09/20 11:56 AM

## 2020-07-09 ENCOUNTER — Other Ambulatory Visit: Payer: Self-pay

## 2020-07-09 ENCOUNTER — Ambulatory Visit: Payer: Managed Care, Other (non HMO) | Admitting: Family Medicine

## 2020-07-09 ENCOUNTER — Encounter: Payer: Self-pay | Admitting: Family Medicine

## 2020-07-09 VITALS — BP 108/68 | HR 63 | Temp 98.4°F | Ht 69.0 in | Wt 174.0 lb

## 2020-07-09 DIAGNOSIS — L989 Disorder of the skin and subcutaneous tissue, unspecified: Secondary | ICD-10-CM

## 2020-07-09 DIAGNOSIS — B351 Tinea unguium: Secondary | ICD-10-CM

## 2020-07-09 NOTE — Patient Instructions (Signed)
Stop salicylic acid for the next month.  Take the Lamisil daily for the next month.   Send me a message in 1 month regarding your 4th toe on the right. I would like a picture and short description of what you are experiencing. If you are better with the medicine, no need to send a message.   Do not use cold/freeze therapy on the toe for the next month.  Let us know if you need anything.

## 2020-07-09 NOTE — Progress Notes (Signed)
Chief Complaint  Patient presents with  . recheck feet    Subjective: Patient is a 60 y.o. male here for f/u toenail fungus.  He was started on Lamisil 250 mg/d. He was not very compliant, but did notice improvements. He tolerated the medicine well.   He has a lesion on the inner 4th digit on the R foot. He has been using asa topical therapy that has turned his lesion white. It normally does not look like anything, it does itch. No pain.   Past Medical History:  Diagnosis Date  . Anemia   . External hemorrhoids     Objective: BP 108/68 (BP Location: Left Arm, Patient Position: Sitting, Cuff Size: Normal)   Pulse 63   Temp 98.4 F (36.9 C) (Oral)   Ht 5\' 9"  (1.753 m)   Wt 174 lb (78.9 kg)   SpO2 95%   BMI 25.70 kg/m  General: Awake, appears stated age Skin: Thickened and yellowed nail plates noted on 1st and 2nd digit on L and also 1st digit on R; there is a white patch on the R 4th digit medially. It is not erythematous, draining, ttp or fluctuant.  Lungs: No accessory muscle use Psych: Age appropriate judgment and insight, normal affect and mood  Assessment and Plan: Skin lesion  Onychomycosis  1. Stop using all topical products. Send message w update and pic in 1 mo.  2. Use Lamisil qd for next mo. Need to see how it affects #1. No need to ck labs as she has not checked recently.  The patient voiced understanding and agreement to the plan.  [place], DO 07/09/20  7:43 AM

## 2021-01-06 ENCOUNTER — Other Ambulatory Visit: Payer: Self-pay | Admitting: Family Medicine

## 2021-05-14 ENCOUNTER — Encounter: Payer: Self-pay | Admitting: Family Medicine

## 2021-05-14 ENCOUNTER — Ambulatory Visit: Payer: Managed Care, Other (non HMO) | Admitting: Family Medicine

## 2021-05-14 VITALS — BP 120/82 | HR 89 | Temp 98.0°F | Ht 69.0 in | Wt 172.1 lb

## 2021-05-14 DIAGNOSIS — R4689 Other symptoms and signs involving appearance and behavior: Secondary | ICD-10-CM

## 2021-05-14 DIAGNOSIS — R413 Other amnesia: Secondary | ICD-10-CM | POA: Diagnosis not present
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
## 2021-05-14 NOTE — Patient Instructions (Addendum)
If you do not hear anything about your referral in the next 1-2 weeks, call our office and ask for an update.  Keep the diet clean and stay active.  Give [place] 2-3 business days to get the results of your labs back.   Aim to do some physical exertion for 150 minutes per week. This is typically divided into 5 days per week, 30 minutes per day. The activity should be enough to get your heart rate up. Anything is better than nothing if you have time constraints.  Let us know if you need anything.

## 2021-05-15 ENCOUNTER — Other Ambulatory Visit: Payer: Managed Care, Other (non HMO)

## 2021-05-15 DIAGNOSIS — E538 Deficiency of other specified B group vitamins: Secondary | ICD-10-CM

## 2021-05-15 LAB — COMPREHENSIVE METABOLIC PANEL
ALT: 24 U/L (ref 0–53)
AST: 27 U/L (ref 0–37)
Albumin: 4.4 g/dL (ref 3.5–5.2)
Alkaline Phosphatase: 55 U/L (ref 39–117)
BUN: 17 mg/dL (ref 6–23)
CO2: 29 mEq/L (ref 19–32)
Calcium: 8.9 mg/dL (ref 8.4–10.5)
Chloride: 98 mEq/L (ref 96–112)
Creatinine, Ser: 1.12 mg/dL (ref 0.40–1.50)
GFR: 72.32 mL/min (ref >60.00)
Glucose, Bld: 97 mg/dL (ref 70–99)
Potassium: 3.6 mEq/L (ref 3.5–5.1)
Sodium: 136 mEq/L (ref 135–145)
Total Bilirubin: 0.4 mg/dL (ref 0.2–1.2)
Total Protein: 6.8 g/dL (ref 6.0–8.3)

## 2021-05-15 LAB — T4, FREE: Free T4: 0.93 ng/dL (ref 0.60–1.60)

## 2021-05-15 LAB — CBC
HCT: 44.6 % (ref 39.0–52.0)
Hemoglobin: 15 g/dL (ref 13.0–17.0)
MCHC: 33.6 g/dL (ref 30.0–36.0)
MCV: 88.9 fl (ref 78.0–100.0)
Platelets: 141 10*3/uL — ABNORMAL LOW (ref 150.0–400.0)
RBC: 5.02 Mil/uL (ref 4.22–5.81)
RDW: 12.7 % (ref 11.5–15.5)
WBC: 6.5 10*3/uL (ref 4.0–10.5)

## 2021-05-15 LAB — TSH: TSH: 0.56 u[IU]/mL (ref 0.35–5.50)

## 2021-05-15 LAB — VITAMIN B12: Vitamin B-12: 198 pg/mL — ABNORMAL LOW (ref 211–911)

## 2021-05-21 LAB — INTRINSIC FACTOR ANTIBODIES: Intrinsic Factor: NEGATIVE

## 2021-07-22 ENCOUNTER — Encounter: Payer: Self-pay | Admitting: Diagnostic Neuroimaging

## 2021-07-22 ENCOUNTER — Ambulatory Visit: Payer: Managed Care, Other (non HMO) | Admitting: Diagnostic Neuroimaging

## 2021-07-22 VITALS — BP 118/79 | HR 82 | Ht 69.0 in | Wt 165.2 lb

## 2021-07-22 DIAGNOSIS — R4689 Other symptoms and signs involving appearance and behavior: Secondary | ICD-10-CM | POA: Diagnosis not present

## 2021-07-22 NOTE — Patient Instructions (Addendum)
ABNORMAL SPELL - check MRI , EEG - start B12 replacement (orally 1069mcg daily)
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
## 2021-07-29 ENCOUNTER — Ambulatory Visit: Payer: Managed Care, Other (non HMO) | Admitting: Diagnostic Neuroimaging

## 2021-07-29 ENCOUNTER — Other Ambulatory Visit: Payer: Self-pay

## 2021-07-29 DIAGNOSIS — R4182 Altered mental status, unspecified: Secondary | ICD-10-CM

## 2021-07-29 DIAGNOSIS — R4689 Other symptoms and signs involving appearance and behavior: Secondary | ICD-10-CM

## 2021-07-30 ENCOUNTER — Telehealth: Payer: Self-pay | Admitting: Diagnostic Neuroimaging

## 2021-07-30 NOTE — Telephone Encounter (Signed)
MR Brain w/wo contrast Dr. Trey Sailors Josem Kaufmann: [place] Ref # [id number]. Patient is scheduled at [facility] for 08/13/21.  ?

## 2021-08-12 NOTE — Telephone Encounter (Signed)
Patient left a voicemail on my phone wanting to cancel his MRI for tomorrow.. I called him back and informed him that I canceled  his appointment and just give me a call back at [phone] when ready to r/s.  ?

## 2021-08-13 ENCOUNTER — Other Ambulatory Visit: Payer: Managed Care, Other (non HMO)

## 2021-08-19 NOTE — Telephone Encounter (Signed)
Patient called to r/s he is scheduled for 08/28/21 at [facility] ?

## 2021-08-23 NOTE — Procedures (Signed)
? ?  [facility] ? ?EEG (ELECTROENCEPHALOGRAM) REPORT ? ? ?STUDY DATE: 07/29/21 ?PATIENT NAME: Michael Maldonado ?DOB: Nov 03, 1960 ?MRN: [id number] ? ?ORDERING CLINICIAN: Andrey Spearman, MD  ? ?TECHNOLOGIST: Myer Peer ?TECHNIQUE: Electroencephalogram was recorded utilizing standard 10-20 system of lead placement and reformatted into average and bipolar montages.  ?RECORDING TIME: 23 minutes  ?ACTIVATION: hyperventilation and photic stimulation ? ?CLINICAL INFORMATION: 61 year old male with transient amnesia. ? ?FINDINGS: Posterior dominant background rhythms, which attenuate with eye opening, ranging 15-16 hertz and 10-15 microvolts. No focal, lateralizing, epileptiform activity or seizures are seen. Patient recorded in the awake and drowsy state. EKG channel shows regular rhythm of 60 beats per minute. ? ? ?IMPRESSION:  ? ?Normal EEG in the awake and drowsy states. ? ? ?INTERPRETING PHYSICIAN:  ?Penni Bombard, MD ?Certified in Neurology, Neurophysiology and Neuroimaging ? ?[facility] ?[address], [address] ?[place], [place] [address] ?[phone] ? ?

## 2021-08-28 ENCOUNTER — Ambulatory Visit: Payer: Managed Care, Other (non HMO) | Admitting: Family Medicine

## 2021-08-28 ENCOUNTER — Encounter: Payer: Self-pay | Admitting: Family Medicine

## 2021-08-28 ENCOUNTER — Ambulatory Visit (INDEPENDENT_AMBULATORY_CARE_PROVIDER_SITE_OTHER): Payer: Managed Care, Other (non HMO)

## 2021-08-28 VITALS — BP 110/69 | HR 58 | Temp 98.0°F | Ht 69.0 in | Wt 164.4 lb

## 2021-08-28 DIAGNOSIS — L989 Disorder of the skin and subcutaneous tissue, unspecified: Secondary | ICD-10-CM

## 2021-08-28 DIAGNOSIS — R202 Paresthesia of skin: Secondary | ICD-10-CM | POA: Diagnosis not present

## 2021-08-28 DIAGNOSIS — B351 Tinea unguium: Secondary | ICD-10-CM | POA: Diagnosis not present

## 2021-08-28 DIAGNOSIS — R4689 Other symptoms and signs involving appearance and behavior: Secondary | ICD-10-CM | POA: Diagnosis not present

## 2021-08-28 MED ORDER — TADALAFIL 20 MG PO TABS: ORAL_TABLET | ORAL | 4 refills | Status: DC

## 2021-08-28 MED ORDER — TERBINAFINE HCL 250 MG PO TABS: 250.0000 mg | ORAL_TABLET | Freq: Every day | ORAL | 3 refills | Status: DC

## 2021-08-28 MED ORDER — GADOBENATE DIMEGLUMINE 529 MG/ML IV SOLN
15.0000 mL | Freq: Once | INTRAVENOUS | Status: AC | PRN
  Administered 2021-08-28: 15 mL via INTRAVENOUS

## 2021-08-28 NOTE — Progress Notes (Signed)
Musculoskeletal Exam ? ?Patient: Michael Maldonado DOB: 12-06-1960 ? ?DOS: 08/28/2021 ? ?SUBJECTIVE: ? ?Chief Complaint:  ? ?Chief Complaint  ?Patient presents with  ? Leg Problem  ?  Numbness in right leg  ? ? ?Michael Maldonado is a 61 y.o.  male for evaluation and treatment of RLE pain.  ? ?Onset:  1 month ago. No inj or change in activity.  ?Location: anterior/lateral R thigh ?Decreased sensation ?Progression of issue:  has worsened ?Associated symptoms: veins over area ?Treatment: to date has been massage.   ?Neurovascular symptoms: no ? ?Patient has a history of toenail fungus on his great toes.  There is no pain or drainage.  He was on Lamisil in the past and did well, he is currently not taking it.  He had no side effects and was compliant when taking it. ? ?He will intermittently get burning over the upper lip/side of his mouth area.  He was given a topical cream by his mother in the past that worked very well but he cannot remember what it is called.  He is requesting recommendations for this. ? ?Patient has a history of erectile dysfunction.  He takes Cialis 20 mg as needed.  He was told his pharmacy would not cover any refills but was unsure if he had any refills or if it was an insurance issue. ? ?Past Medical History:  ?Diagnosis Date  ? Anemia   ? External hemorrhoids   ? ? ?Objective: ?VITAL SIGNS: BP 110/69   Pulse (!) 58   Temp 98 ?F (36.7 ?C) (Oral)   Ht '5\' 9"'$  (1.753 m)   Wt 164 lb 6 oz (74.6 kg)   SpO2 98%   BMI 24.27 kg/m?  ?Constitutional: Well formed, well developed. No acute distress. ?Thorax & Lungs: No accessory muscle use ?Musculoskeletal: R thigh.   ?Tenderness to palpation: no ?Deformity: no ?Ecchymosis: no ?Tests positive: none ?Tests negative: straight leg ?Skin: Nontender varicose veins noted over the lateral thigh on the right, no ulcerations ?-Yellowed and thickened nails over the first and second toe on the right and first toe on the left. ?-There are no external perioral lesions  appreciated (also was not complaining of any current burning sensation in his face) ?Neurologic: Normal sensory function to light touch. Decreased sensation to pinprick over antero-lateral R thigh compared to surrounding tissue. No focal deficits noted. DTR's equal and symmetric in LE's. No clonus. Gait nml ?Psychiatric: Normal mood. Age appropriate judgment and insight. Alert & oriented x 3.   ? ?Assessment: ? ?Paresthesias ? ?Onychomycosis - Plan: terbinafine (LAMISIL) 250 MG tablet ? ?Skin lesion ? ?Plan: ?Could be superficial nerve, hip stretches/exercises, heat, ice, Tylenol.  Politely declined any medication to treat this. Fu prn.  ?We will restart Lamisil 250 mg daily. ?Described various burning sensations over the counter oral region.  He is asking for a topical medication, recommended capsaicin cream and warned him that this will burn initially. ?Refilled Cialis, if there is an insurance issue, I also printed this off for him and recommend using GoodRx for it. ?The patient voiced understanding and agreement to the plan. ? ?I spent 35 minutes with the patient discussing the above in addition to reviewing his chart on the same day of the visit. ? ?Shelda Pal, DO ?08/28/21  ?12:09 PM ? ?

## 2021-08-28 NOTE — Patient Instructions (Addendum)
Heat (pad or rice pillow in microwave) over affected area, 10-15 minutes twice daily.  ? ?Ice/cold pack over area for 10-15 min twice daily. ? ?Consider topical capsaicin cream for your face. This will burn at first.  ? ?Use GoodRx for your Cialis if too expensive at the pharmacy.  ? ?Let us know if you need anything. ? ?Hip Exercises ?It is normal to feel mild stretching, pulling, tightness, or discomfort as you do these exercises, but you should stop right away if you feel sudden pain or your pain gets worse.  ? ?STRETCHING AND RANGE OF MOTION EXERCISES ?These exercises warm up your muscles and joints and improve the movement and flexibility of your hip. These exercises also help to relieve pain, numbness, and tingling. ?Exercise A: Hamstrings, Supine ? ?Lie on your back. ?Loop a belt or towel over the ball of your left / right foot. The ball of your foot is on the walking surface, right under your toes. ?Straighten your left / right knee and slowly pull on the belt to raise your leg. ?Do not let your left / right knee bend while you do this. ?Keep your other leg flat on the floor. ?Raise the left / right leg until you feel a gentle stretch behind your left / right knee or thigh. ?Hold this position for 30 seconds. ?Slowly return your leg to the starting position. ?Repeat2 times. Complete this stretch 3 times per week. ?Exercise B: Hip Rotators ? ?Lie on your back on a firm surface. ?Hold your left / right knee with your left / right hand. Hold your ankle with your other hand. ?Gently pull your left / right knee and rotate your lower leg toward your other shoulder. ?Pull until you feel a stretch in your buttocks. ?Keep your hips and shoulders firmly planted while you do this stretch. ?Hold this position for 30 seconds. ?Repeat 2 times. Complete this stretch 3 times per week. ?Exercise C: V-Sit (Hamstrings and Adductors) ? ?Sit on the floor with your legs extended in a large ?V? shape. Keep your knees straight  during this exercise. ?Start with your head and chest upright, then bend at your waist to reach for your left foot (position A). You should feel a stretch in your right inner thigh. ?Hold this position for 30 seconds. Then slowly return to the upright position. ?Bend at your waist to reach forward (position B). You should feel a stretch behind both of your thighs and knees. ?Hold this position for 30 seconds. Then slowly return to the upright position. ?Bend at your waist to reach for your right foot (position C). You should feel a stretch in your left inner thigh. ?Hold this position for 30 seconds. Then slowly return to the upright position. ?Repeat A, B, and C 2 times each. Complete this stretch 3 times per week. ?Exercise D: Lunge (Hip Flexors) ? ?Place your left / right knee on the floor and bend your other knee so that is directly over your ankle. You should be half-kneeling. ?Keep good posture with your head over your shoulders. ?Tighten your buttocks to point your tailbone downward. This helps your back to keep from arching too much. ?You should feel a gentle stretch in the front of your left / right thigh and hip. If you do not feel any resistance, slightly slide your other foot forward and then slowly lunge forward so your knee once again lines up over your ankle. ?Make sure your tailbone continues to point downward. ?Hold this  position for 30 seconds. ?Repeat 2 times. Complete this stretch 3 times per week. ? ?STRENGTHENING EXERCISES ?These exercises build strength and endurance in your hip. Endurance is the ability to use your muscles for a long time, even after they get tired. ?Exercise E: Bridge (Hip Extensors) ? ?Lie on your back on a firm surface with your knees bent and your feet flat on the floor. ?Tighten your buttocks muscles and lift your bottom off the floor until the trunk of your body is level with your thighs. ?Do not arch your back. ?You should feel the muscles working in your buttocks and  the back of your thighs. If you do not feel these muscles, slide your feet 1-2 inches (2.5-5 cm) farther away from your buttocks. ?Hold this position for 3 seconds. ?Slowly lower your hips to the starting position. Repeat for a total of 10 repetitions. ?Let your muscles relax completely between repetitions. ?If this exercise is too easy, try doing it with your arms crossed over your chest. ?Repeat 2 times. Complete this exercise 3 times per week. ?Exercise F: Straight Leg Raises - Hip Abductors ? ?Lie on your side with your left / right leg in the top position. Lie so your head, shoulder, knee, and hip line up with each other. You may bend your bottom knee to help you balance. ?Roll your hips slightly forward, so your hips are stacked directly over each other and your left / right knee is facing forward. ?Leading with your heel, lift your top leg 4-6 inches (10-15 cm). You should feel the muscles in your outer hip lifting. ?Do not let your foot drift forward. ?Do not let your knee roll toward the ceiling. ?Hold this position for 1 second. ?Slowly return to the starting position. ?Let your muscles relax completely between repetitions. Repeat for a total of 10 repetitions.  ?Repeat 2 times. Complete this exercise 3 times per week. ?Exercise G: Straight Leg Raises - Hip Adductors ? ?Lie on your side with your left / right leg in the bottom position. Lie so your head, shoulder, knee, and hip line up. You may place your upper foot in front to help you balance. ?Roll your hips slightly forward, so your hips are stacked directly over each other and your left / right knee is facing forward. ?Tense the muscles in your inner thigh and lift your bottom leg 4-6 inches (10-15 cm). ?Hold this position for 1 second. ?Slowly return to the starting position. ?Let your muscles relax completely between repetitions. Repeat for a total of 10 repetitions. ?Repeat 2 times. Complete this exercise 3 times per week. ?Exercise H: Straight Leg  Raises - Quadriceps ? ?Lie on your back with your left / right leg extended and your other knee bent. ?Tense the muscles in the front of your left / right thigh. When you do this, you should see your kneecap slide up or see increased dimpling just above your knee. ?Tighten these muscles even more and raise your leg 4-6 inches (10-15 cm) off the floor. ?Hold this position for 3 seconds. ?Keep these muscles tense as you lower your leg. ?Relax the muscles slowly and completely between repetitions. Repeat for a total of 10 repetitions. ?Repeat 2 times. Complete this exercise 3 times per week. ?Exercise I: Hip Abductors, Standing ?Tie one end of a rubber exercise band or tubing to a secure surface, such as a table or pole. ?Loop the other end of the band or tubing around your left / right ankle. ?  Keeping your ankle with the band or tubing directly opposite of the secured end, step away until there is tension in the tubing or band. Hold onto a chair as needed for balance. ?Lift your left / right leg out to your side. While you do this: ?Keep your back upright. ?Keep your shoulders over your hips. ?Keep your toes pointing forward. ?Make sure to use your hip muscles to lift your leg. Do not "throw" your leg or tip your body to lift your leg. ?Hold this position for 1 second. ?Slowly return to the starting position. Repeat for a total of 10 repetitions. ?Repeat 2 times. Complete this exercise 3 times per week. ?Exercise J: Squats (Quadriceps) ?Stand in a door frame so your feet and knees are in line with the frame. You may place your hands on the frame for balance. ?Slowly bend your knees and lower your hips like you are going to sit in a chair. ?Keep your lower legs in a straight-up-and-down position. ?Do not let your hips go lower than your knees. ?Do not bend your knees lower than told by your health care provider. ?If your hip pain increases, do not bend as low. ?Hold this position for 1 second. ?Slowly push with your  legs to return to standing. Do not use your hands to pull yourself to standing. Repeat for a total of 10 repetitions. ?Repeat 2 times. Complete this exercise 3 times per week. ?Make sure you discuss any qu

## 2023-10-23 ENCOUNTER — Encounter: Payer: Self-pay | Admitting: Family Medicine

## 2023-10-23 ENCOUNTER — Ambulatory Visit: Admitting: Family Medicine

## 2023-10-23 ENCOUNTER — Inpatient Hospital Stay: Admitting: Family Medicine

## 2023-10-23 VITALS — BP 130/76 | HR 95 | Temp 98.0°F | Resp 16 | Ht 69.0 in | Wt 177.6 lb

## 2023-10-23 DIAGNOSIS — K2100 Gastro-esophageal reflux disease with esophagitis, without bleeding: Secondary | ICD-10-CM

## 2023-10-23 NOTE — Patient Instructions (Signed)
 The only lifestyle changes that have data behind them are weight loss for the overweight/obese and elevating the head of the bed. Finding out which foods/positions are triggers is important.  Pepcid/famotidine 20 mg 1-2 times daily can help with reflux symptoms over this next year.   You are due for a colonoscopy in Dec.   Let us  know if you need anything.

## 2023-10-23 NOTE — Progress Notes (Signed)
 Chief Complaint  Patient presents with   Follow-up    ER Follow Up    Subjective: Patient is a 63 y.o. male here for ER f/u.  On 10/15/23, he ate some spicy chips from [facility]. He started having chest pain. He went to the ED thru [facility]. Workup was neg. Has a burning sensation in his neck and chest region. No longer having abd pain. Denies difficulty swallowing, fevers, abd pain, bowel changes/tarry stools. Has been using an OTC antacid. Had cut out spicy foods, alcohol, caffeine and has been improving.   Past Medical History:  Diagnosis Date   Anemia    External hemorrhoids     Objective: BP 130/76 (BP Location: Left Arm, Patient Position: Sitting)   Pulse 95   Temp 98 F (36.7 C) (Oral)   Resp 16   Ht 5\' 9"  (1.753 m)   Wt 177 lb 9.6 oz (80.6 kg)   SpO2 98%   BMI 26.23 kg/m  General: Awake, appears stated age Heart: RRR, no LE edema Lungs: CTAB, no rales, wheezes or rhonchi. No accessory muscle use Abd: BS+, S, NT, ND MSK: No ttp over chest Mouth: MMM Psych: Age appropriate judgment and insight, normal affect and mood  Assessment and Plan: Gastroesophageal reflux disease with esophagitis, unspecified whether hemorrhage  Spicy chips flared reflux s/s's. Pepcid 20 mg 1-2 times daily for the next 3-4 weeks. If no improvement, consider GI referral and PPI trial. F/u for CPE at convenience.  The patient voiced understanding and agreement to the plan.  Shellie Dials Waggaman, DO 10/23/23  1:10 PM

## 2024-01-18 ENCOUNTER — Encounter: Payer: Self-pay | Admitting: Family Medicine

## 2024-01-18 ENCOUNTER — Ambulatory Visit (INDEPENDENT_AMBULATORY_CARE_PROVIDER_SITE_OTHER): Admitting: Family Medicine

## 2024-01-18 VITALS — BP 124/72 | HR 74 | Temp 98.0°F | Resp 16 | Ht 69.0 in | Wt 175.6 lb

## 2024-01-18 DIAGNOSIS — N486 Induration penis plastica: Secondary | ICD-10-CM | POA: Diagnosis not present

## 2024-01-18 DIAGNOSIS — Z125 Encounter for screening for malignant neoplasm of prostate: Secondary | ICD-10-CM

## 2024-01-18 DIAGNOSIS — Z0000 Encounter for general adult medical examination without abnormal findings: Secondary | ICD-10-CM | POA: Diagnosis not present

## 2024-01-18 LAB — CBC
HCT: 44.4 % (ref 39.0–52.0)
Hemoglobin: 14.8 g/dL (ref 13.0–17.0)
MCHC: 33.3 g/dL (ref 30.0–36.0)
MCV: 87.8 fl (ref 78.0–100.0)
Platelets: 189 K/uL (ref 150.0–400.0)
RBC: 5.06 Mil/uL (ref 4.22–5.81)
RDW: 13.4 % (ref 11.5–15.5)
WBC: 5.6 K/uL (ref 4.0–10.5)

## 2024-01-18 LAB — LIPID PANEL
Cholesterol: 132 mg/dL (ref 0–200)
HDL: 45.7 mg/dL (ref >39.00)
LDL Cholesterol: 74 mg/dL (ref 0–99)
NonHDL: 86.67
Total CHOL/HDL Ratio: 3
Triglycerides: 65 mg/dL (ref 0.0–149.0)
VLDL: 13 mg/dL (ref 0.0–40.0)

## 2024-01-18 LAB — COMPREHENSIVE METABOLIC PANEL WITH GFR
ALT: 21 U/L (ref 0–53)
AST: 25 U/L (ref 0–37)
Albumin: 4.4 g/dL (ref 3.5–5.2)
Alkaline Phosphatase: 50 U/L (ref 39–117)
BUN: 18 mg/dL (ref 6–23)
CO2: 29 meq/L (ref 19–32)
Calcium: 8.9 mg/dL (ref 8.4–10.5)
Chloride: 104 meq/L (ref 96–112)
Creatinine, Ser: 1.06 mg/dL (ref 0.40–1.50)
GFR: 75.82 mL/min (ref >60.00)
Glucose, Bld: 87 mg/dL (ref 70–99)
Potassium: 3.8 meq/L (ref 3.5–5.1)
Sodium: 143 meq/L (ref 135–145)
Total Bilirubin: 0.7 mg/dL (ref 0.2–1.2)
Total Protein: 6.5 g/dL (ref 6.0–8.3)

## 2024-01-18 LAB — PSA: PSA: 2.14 ng/mL (ref 0.10–4.00)

## 2024-01-18 NOTE — Patient Instructions (Addendum)
 Give us  2-3 business days to get the results of your labs back.   Keep the diet clean and stay active.  Please consider getting the pneumonia vaccine (PCV20 or 21).   I recommend getting the flu shot in mid October. This suggestion would change if the CDC comes out with a different recommendation.   If you do not hear anything about your referral in the next 1-2 weeks, call our office and ask for an update.  Please get me a copy of your advanced directive form at your convenience.   Pepcid/famotidine 20 mg 1-2 times daily can help with reflux symptoms.   Please contact the GI team at: [phone] for your colonoscopy coming due this Dec.   Let us  know if you need anything.

## 2024-01-18 NOTE — Progress Notes (Signed)
 Chief Complaint  Patient presents with   Annual Exam    CPE    Well Male Michael Maldonado is here for a complete physical.   His last physical was >1 year ago.  Current diet: in general, a healthy diet.  Current exercise: active at work Weight trend: stable Fatigue out of ordinary? No. Seat belt? Yes.   Advanced directive? No  Health maintenance Shingrix- No Colonoscopy- Yes Tetanus- Yes HIV- Yes Hep C- Yes   Past Medical History:  Diagnosis Date   Anemia    External hemorrhoids       Past Surgical History:  Procedure Laterality Date   BRAIN SURGERY  1996   Remove araenoid cyst benign   CYSTECTOMY     HEMORRHOID SURGERY  2018    Medications  Current Outpatient Medications on File Prior to Visit  Medication Sig Dispense Refill   Multiple Vitamin (MULTIVITAMIN) tablet Take 1 tablet by mouth daily.     Zinc 50 MG CAPS Take 1 each by mouth.     Allergies No Known Allergies  Family History Family History  Problem Relation Age of Onset   Cancer Mother        colon   Colon cancer Mother    Cancer Father    Hearing loss Father    Esophageal cancer Neg Hx    Rectal cancer Neg Hx    Stomach cancer Neg Hx     Review of Systems: Constitutional:  no fevers Eye:  no recent significant change in vision Ear/Nose/Mouth/Throat:  Ears:  no hearing loss Nose/Mouth/Throat:  no complaints of nasal congestion, no sore throat Cardiovascular:  no chest pain Respiratory:  no shortness of breath Gastrointestinal:  no change in bowel habits GU:  Male: negative for dysuria, frequency Musculoskeletal/Extremities:  no joint pain Integumentary (Skin/Breast):  no abnormal skin lesions reported Neurologic:  no headaches Endocrine: No unexpected weight changes Hematologic/Lymphatic:  no abnormal bleeding  Exam BP 124/72 (BP Location: Left Arm, Patient Position: Sitting)   Pulse 74   Temp 98 F (36.7 C) (Oral)   Resp 16   Ht 5' 9 (1.753 m)   Wt 175 lb 9.6 oz (79.7 kg)    SpO2 96%   BMI 25.93 kg/m  General:  well developed, well nourished, in no apparent distress Skin:  no significant moles, warts, or growths Head:  no masses, lesions, or tenderness Eyes:  pupils equal and round, sclera anicteric without injection Ears:  canals without lesions, TMs shiny without retraction, no obvious effusion, no erythema Nose:  nares patent, mucosa normal Throat/Pharynx:  lips and gingiva without lesion; tongue and uvula midline; non-inflamed pharynx; no exudates or postnasal drainage Neck: neck supple without adenopathy, thyromegaly, or masses Cardiac: RRR, no bruits, no LE edema Lungs:  clear to auscultation, breath sounds equal bilaterally, no respiratory distress Abdomen: BS+, soft, non-tender, non-distended, no masses or organomegaly noted Rectal: Deferred Musculoskeletal:  symmetrical muscle groups noted without atrophy or deformity Neuro:  gait normal; deep tendon reflexes normal and symmetric Psych: well oriented with normal range of affect and appropriate judgment/insight  Assessment and Plan  Well adult exam - Plan: CBC, Comprehensive metabolic panel with GFR, Lipid panel  Screening for prostate cancer - Plan: PSA  Peyronie disease - Plan: Ambulatory referral to Urology   Well 63 y.o. male. Counseled on diet and exercise. Counseled on risks and benefits of prostate cancer screening with PSA. The patient agrees to undergo testing. 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  GI info provided.  Due for CCS this Dec.  PCV20 and Shingrix politely declined.  Immunizations, labs, and further orders as above. Follow up in 1 yr. The patient voiced understanding and agreement to the plan.  Mabel [place], DO 01/18/24 11:14 AM

## 2024-01-19 ENCOUNTER — Ambulatory Visit: Payer: Self-pay | Admitting: Family Medicine

## 2024-02-22 ENCOUNTER — Encounter: Payer: Self-pay | Admitting: Urology

## 2024-02-22 ENCOUNTER — Ambulatory Visit: Admitting: Urology

## 2024-02-22 VITALS — BP 131/82 | HR 58 | Ht 69.0 in | Wt 175.0 lb

## 2024-02-22 DIAGNOSIS — N486 Induration penis plastica: Secondary | ICD-10-CM | POA: Diagnosis not present

## 2024-02-22 NOTE — Progress Notes (Signed)
 Assessment: 1. Peyronie's disease     Plan: Diagnosis and management of Peyronie's disease discussed with the patient. I discussed the natural history and course of Peyronie's.  I discussed treatment options including injection therapy with collagenase as well as surgical management.  I advised him that treatment is generally aimed at improving the curvature to allow intercourse. Information on Xiaflex provided. He will contact the office if he is interested in pursuing treatment.  Chief Complaint:  Chief Complaint  Patient presents with   Abnormal Penile Curvature    History of Present Illness:  Michael Maldonado is a 63 y.o. male who is seen in consultation from [place], DO for evaluation of Peyronie's disease. He reports a history of some dorsal curvature to the penis.  He noted some worsening of the curvature approximately 4-5 months ago.  He is not aware of any traumatic event.  He has not had any pain with erections.  He reports a 45 degree dorsal curvature.  He is able to achieve an erection without difficulty.  He does have early morning erections.  He is currently not sexually active and has not tried intercourse since noting the increased curvature.  He has used Cialis  for difficulty maintaining his erections.  He has not used this recently as he is not currently sexually active.  He is not having any lower urinary tract symptoms.   Past Medical History:  Past Medical History:  Diagnosis Date   Anemia    External hemorrhoids     Past Surgical History:  Past Surgical History:  Procedure Laterality Date   BRAIN SURGERY  1996   Remove araenoid cyst benign   CYSTECTOMY     HEMORRHOID SURGERY  2018    Allergies:  No Known Allergies  Family History:  Family History  Problem Relation Age of Onset   Cancer Mother        colon   Colon cancer Mother    Cancer Father    Hearing loss Father    Esophageal cancer Neg Hx    Rectal cancer Neg Hx     Stomach cancer Neg Hx     Social History:  Social History   Tobacco Use   Smoking status: Never   Smokeless tobacco: Never  Vaping Use   Vaping status: Never Used  Substance Use Topics   Alcohol use: Yes    Comment: occ   Drug use: No    Review of symptoms:  Constitutional:  Negative for unexplained weight loss, night sweats, fever, chills ENT:  Negative for nose bleeds, sinus pain, painful swallowing CV:  Negative for chest pain, shortness of breath, exercise intolerance, palpitations, loss of consciousness Resp:  Negative for cough, wheezing, shortness of breath GI:  Negative for nausea, vomiting, diarrhea, bloody stools GU:  Positives noted in HPI; otherwise negative for gross hematuria, dysuria, urinary incontinence Neuro:  Negative for seizures, poor balance, limb weakness, slurred speech Psych:  Negative for lack of energy, depression, anxiety Endocrine:  Negative for polydipsia, polyuria, symptoms of hypoglycemia (dizziness, hunger, sweating) Hematologic:  Negative for anemia, purpura, petechia, prolonged or excessive bleeding, use of anticoagulants  Allergic:  Negative for difficulty breathing or choking as a result of exposure to anything; no shellfish allergy; no allergic response (rash/itch) to materials, foods  Physical exam: BP 131/82   Pulse (!) 58   Ht 5' 9 (1.753 m)   Wt 175 lb (79.4 kg)   BMI 25.84 kg/m  GENERAL APPEARANCE:  Well appearing, well  developed, well nourished, NAD HEENT: Atraumatic, Normocephalic, oropharynx clear. NECK: Supple without lymphadenopathy or thyromegaly. LUNGS: Clear to auscultation bilaterally. HEART: Regular Rate and Rhythm without murmurs, gallops, or rubs. ABDOMEN: Soft, non-tender, No Masses. EXTREMITIES: Moves all extremities well.  Without clubbing, cyanosis, or edema. NEUROLOGIC:  Alert and oriented x 3, normal gait, CN II-XII grossly intact.  MENTAL STATUS:  Appropriate. BACK:  Non-tender to palpation.  No CVAT SKIN:   Warm, dry and intact.   GU: Penis:  circumcised; dorsal plaque palpated Meatus: Normal Scrotum: normal, no masses Testis: normal without masses bilateral  Results: None

## 2024-05-11 ENCOUNTER — Telehealth: Payer: Self-pay

## 2024-05-11 DIAGNOSIS — Z1211 Encounter for screening for malignant neoplasm of colon: Secondary | ICD-10-CM

## 2024-05-11 NOTE — Telephone Encounter (Signed)
 Chart reviewed, referral was not placed to GI. Referral placed.

## 2024-05-11 NOTE — Telephone Encounter (Signed)
 Copied from CRM #[id number]. Topic: General - Other >> May 11, 2024  9:37 AM Deleta S wrote: Reason for CRM: patient is calling due to a colon was supposed to be schedule and he has not received any calls. Please contact the patient at [phone]

## 2024-06-29 ENCOUNTER — Encounter: Payer: Self-pay | Admitting: Internal Medicine
